# Patient Record
Sex: Male | Born: 1980 | Hispanic: Yes | Marital: Single | Smoking: Never smoker
Health system: Southern US, Community
[De-identification: ages and names within clinical notes are randomized; demographics above are authoritative.]

## PROBLEM LIST (undated history)

## (undated) DIAGNOSIS — E7800 Pure hypercholesterolemia, unspecified: Secondary | ICD-10-CM

---

## 2014-04-23 ENCOUNTER — Emergency Department (HOSPITAL_COMMUNITY)
Admission: EM | Admit: 2014-04-23 | Discharge: 2014-04-24 | Disposition: A | Discharge status: Home / Self Care | Payer: Self-pay | Attending: Emergency Medicine | Admitting: Emergency Medicine

## 2014-04-23 ENCOUNTER — Encounter (HOSPITAL_COMMUNITY): Payer: Self-pay | Admitting: Emergency Medicine

## 2014-04-23 DIAGNOSIS — Z8639 Personal history of other endocrine, nutritional and metabolic disease: Secondary | ICD-10-CM | POA: Insufficient documentation

## 2014-04-23 DIAGNOSIS — Z862 Personal history of diseases of the blood and blood-forming organs and certain disorders involving the immune mechanism: Secondary | ICD-10-CM | POA: Insufficient documentation

## 2014-04-23 DIAGNOSIS — R1032 Left lower quadrant pain: Secondary | ICD-10-CM | POA: Insufficient documentation

## 2014-04-23 DIAGNOSIS — R109 Unspecified abdominal pain: Secondary | ICD-10-CM

## 2014-04-23 DIAGNOSIS — R1033 Periumbilical pain: Secondary | ICD-10-CM | POA: Insufficient documentation

## 2014-04-23 HISTORY — DX: Pure hypercholesterolemia, unspecified: E78.00

## 2014-04-23 LAB — CBC WITH DIFFERENTIAL/PLATELET
BASOS ABS: 0 10*3/uL (ref 0.0–0.1)
Basophils Relative: 0 % (ref 0–1)
Eosinophils Absolute: 0.2 10*3/uL (ref 0.0–0.7)
Eosinophils Relative: 4 % (ref 0–5)
HCT: 39.3 % (ref 39.0–52.0)
HEMOGLOBIN: 13.6 g/dL (ref 13.0–17.0)
LYMPHS PCT: 67 % — AB (ref 12–46)
Lymphs Abs: 3.9 10*3/uL (ref 0.7–4.0)
MCH: 28.9 pg (ref 26.0–34.0)
MCHC: 34.6 g/dL (ref 30.0–36.0)
MCV: 83.6 fL (ref 78.0–100.0)
MONO ABS: 0.5 10*3/uL (ref 0.1–1.0)
MONOS PCT: 8 % (ref 3–12)
NEUTROS PCT: 21 % — AB (ref 43–77)
Neutro Abs: 1.2 10*3/uL — ABNORMAL LOW (ref 1.7–7.7)
Platelets: 248 10*3/uL (ref 150–400)
RBC: 4.7 MIL/uL (ref 4.22–5.81)
RDW: 12.3 % (ref 11.5–15.5)
WBC: 5.9 10*3/uL (ref 4.0–10.5)

## 2014-04-23 LAB — URINALYSIS, ROUTINE W REFLEX MICROSCOPIC
BILIRUBIN URINE: NEGATIVE
Glucose, UA: NEGATIVE mg/dL
HGB URINE DIPSTICK: NEGATIVE
KETONES UR: NEGATIVE mg/dL
LEUKOCYTES UA: NEGATIVE
Nitrite: NEGATIVE
PH: 6 (ref 5.0–8.0)
PROTEIN: NEGATIVE mg/dL
Specific Gravity, Urine: 1.025 (ref 1.005–1.030)
Urobilinogen, UA: 0.2 mg/dL (ref 0.0–1.0)

## 2014-04-23 NOTE — ED Notes (Signed)
Pt arrives with abdominal pain that started 3 days ago, denies N/V/D. Pain with urination, states it feels "rough" Non English speaking

## 2014-04-24 ENCOUNTER — Emergency Department (HOSPITAL_COMMUNITY): Payer: Self-pay

## 2014-04-24 LAB — COMPREHENSIVE METABOLIC PANEL
ALT: 39 U/L (ref 0–53)
AST: 28 U/L (ref 0–37)
Albumin: 4 g/dL (ref 3.5–5.2)
Alkaline Phosphatase: 85 U/L (ref 39–117)
BILIRUBIN TOTAL: 0.2 mg/dL — AB (ref 0.3–1.2)
BUN: 11 mg/dL (ref 6–23)
CO2: 26 meq/L (ref 19–32)
CREATININE: 1.21 mg/dL (ref 0.50–1.35)
Calcium: 9.3 mg/dL (ref 8.4–10.5)
Chloride: 100 mEq/L (ref 96–112)
GFR calc Af Amer: 89 mL/min — ABNORMAL LOW (ref >90)
GFR, EST NON AFRICAN AMERICAN: 77 mL/min — AB (ref >90)
Glucose, Bld: 89 mg/dL (ref 70–99)
POTASSIUM: 3.8 meq/L (ref 3.7–5.3)
Sodium: 141 mEq/L (ref 137–147)
Total Protein: 7.2 g/dL (ref 6.0–8.3)

## 2014-04-24 LAB — LIPASE, BLOOD: Lipase: 37 U/L (ref 11–59)

## 2014-04-24 LAB — ETHANOL: Alcohol, Ethyl (B): <11 mg/dL (ref 0–11)

## 2014-04-24 MED ORDER — HYDROMORPHONE HCL PF 1 MG/ML IJ SOLN
1.0000 mg | Freq: Once | INTRAMUSCULAR | Status: AC
  Administered 2014-04-24: 1 mg via INTRAVENOUS
  Filled 2014-04-24: qty 1

## 2014-04-24 MED ORDER — ONDANSETRON HCL 4 MG/2ML IJ SOLN
4.0000 mg | Freq: Once | INTRAMUSCULAR | Status: AC
  Administered 2014-04-24: 4 mg via INTRAVENOUS
  Filled 2014-04-24: qty 2

## 2014-04-24 MED ORDER — SODIUM CHLORIDE 0.9 % IV BOLUS (SEPSIS)
1000.0000 mL | Freq: Once | INTRAVENOUS | Status: AC
  Administered 2014-04-24: 1000 mL via INTRAVENOUS

## 2014-04-24 MED ORDER — IOHEXOL 300 MG/ML  SOLN
25.0000 mL | Freq: Once | INTRAMUSCULAR | Status: AC | PRN
  Administered 2014-04-24: 25 mL via ORAL

## 2014-04-24 MED ORDER — IOHEXOL 300 MG/ML  SOLN: 100.0000 mL | Freq: Once | INTRAMUSCULAR | Status: DC | PRN

## 2014-04-24 MED ORDER — HYDROCODONE-ACETAMINOPHEN 5-325 MG PO TABS: 2.0000 | ORAL_TABLET | ORAL | Status: AC | PRN

## 2014-04-24 MED ORDER — SODIUM CHLORIDE 0.9 % IV SOLN
INTRAVENOUS | Status: DC
  Administered 2014-04-24: 999 mL via INTRAVENOUS

## 2014-04-24 NOTE — ED Notes (Signed)
Pt went to ct and is now back.  Denies pain at this time.

## 2014-04-24 NOTE — ED Provider Notes (Signed)
CSN: [id number]     Arrival date & time 04/23/14  2234 History   First MD Initiated Contact with Patient 04/24/14 0010     Chief Complaint  Patient presents with  . Abdominal Pain     (Consider location/radiation/quality/duration/timing/severity/associated sxs/prior Treatment) Patient is a 33 y.o. male presenting with abdominal pain. The history is provided by the patient.  Abdominal Pain  He has had intermittent abdominal pain, for 4 days, which is worse each day. He denies fever, chills, nausea, vomiting, constipation, or diarrhea. He's never had this previously. The pain is currently 5/10. No known sick contacts. He works, doing Network engineerinsulation, and metal work. There are no other known modifying factors.  Past Medical History  Diagnosis Date  . High cholesterol    History reviewed. No pertinent past surgical history. No family history on file. History  Substance Use Topics  . Smoking status: Never Smoker   . Smokeless tobacco: Not on file  . Alcohol Use: Yes    Review of Systems  Gastrointestinal: Positive for abdominal pain.  All other systems reviewed and are negative.     Allergies  Review of patient's allergies indicates no known allergies.  Home Medications   Prior to Admission medications   Medication Sig Start Date End Date Taking? Authorizing Provider  HYDROcodone-acetaminophen (NORCO) 5-325 MG per tablet Take 2 tablets by mouth every 4 (four) hours as needed. 04/24/14   Flint MelterElliott L Tasheka Houseman, MD   BP 132/80  Pulse 63  Temp(Src) 98.3 F (36.8 C) (Oral)  Resp 12  Ht 5' (1.524 m)  Wt 160 lb (72.576 kg)  BMI 31.25 kg/m2  SpO2 100% Physical Exam  Nursing note and vitals reviewed. Constitutional: He is oriented to person, place, and time. He appears well-developed and well-nourished.  HENT:  Head: Normocephalic and atraumatic.  Right Ear: External ear normal.  Left Ear: External ear normal.  Eyes: Conjunctivae and EOM are normal. Pupils are equal, round, and  reactive to light.  Neck: Normal range of motion and phonation normal. Neck supple.  Cardiovascular: Normal rate, regular rhythm, normal heart sounds and intact distal pulses.   Pulmonary/Chest: Effort normal and breath sounds normal. He exhibits no bony tenderness.  Abdominal: Soft. He exhibits no mass. There is no tenderness (Left lower quadrant, and periumbilical tenderness, moderate). There is no rebound and no guarding.  Musculoskeletal: Normal range of motion.  Neurological: He is alert and oriented to person, place, and time. No cranial nerve deficit or sensory deficit. He exhibits normal muscle tone. Coordination normal.  Skin: Skin is warm, dry and intact.  Psychiatric: He has a normal mood and affect. His behavior is normal. Judgment and thought content normal.    ED Course  Procedures (including critical care time) Medications  0.9 %  sodium chloride infusion ( Intravenous Stopped 04/24/14 0553)  iohexol (OMNIPAQUE) 300 MG/ML solution 100 mL (not administered)  sodium chloride 0.9 % bolus 1,000 mL (0 mLs Intravenous Stopped 04/24/14 0114)  HYDROmorphone (DILAUDID) injection 1 mg (1 mg Intravenous Given 04/24/14 0156)  ondansetron (ZOFRAN) injection 4 mg (4 mg Intravenous Given 04/24/14 0156)  iohexol (OMNIPAQUE) 300 MG/ML solution 25 mL (25 mLs Oral Contrast Given 04/24/14 0201)    Patient Vitals for the past 24 hrs:  BP Temp Temp src Pulse Resp SpO2 Height Weight  04/24/14 0530 132/80 mmHg - - 63 - 100 % - -  04/24/14 0500 119/90 mmHg - - 62 - 100 % - -  04/24/14 0430 129/83 mmHg - -  56 - 100 % - -  04/24/14 0400 129/65 mmHg - - 65 - 97 % - -  04/24/14 0330 116/74 mmHg - - 58 - 97 % - -  04/24/14 0300 109/85 mmHg - - 64 - 97 % - -  04/24/14 0240 - - - 68 - 98 % - -  04/24/14 0238 132/87 mmHg - - - - - - -  04/24/14 0200 134/97 mmHg - - 65 12 100 % - -  04/24/14 0130 119/83 mmHg - - 61 16 98 % - -  04/24/14 0100 133/92 mmHg - - 56 18 100 % - -  04/24/14 0045 130/90 mmHg - -  54 12 100 % - -  04/24/14 0030 139/83 mmHg - - 55 19 100 % - -  04/24/14 0015 135/88 mmHg - - 52 12 100 % - -  04/24/14 0000 131/93 mmHg - - 58 14 100 % - -  04/23/14 2345 128/87 mmHg - - 54 18 100 % - -  04/23/14 2330 140/89 mmHg - - 62 19 100 % - -  04/23/14 2247 134/90 mmHg 98.3 F (36.8 C) Oral 57 12 100 % 5' (1.524 m) 160 lb (72.576 kg)       Labs Review Labs Reviewed  CBC WITH DIFFERENTIAL - Abnormal; Notable for the following:    Neutrophils Relative % 21 (*)    Neutro Abs 1.2 (*)    Lymphocytes Relative 67 (*)    All other components within normal limits  COMPREHENSIVE METABOLIC PANEL - Abnormal; Notable for the following:    Total Bilirubin 0.2 (*)    GFR calc non Af Amer 77 (*)    GFR calc Af Amer 89 (*)    All other components within normal limits  LIPASE, BLOOD  URINALYSIS, ROUTINE W REFLEX MICROSCOPIC  ETHANOL    CLINICAL DATA: Right-sided abdominal pain.  EXAM:  CT ABDOMEN AND PELVIS WITH CONTRAST  TECHNIQUE:  Multidetector CT imaging of the abdomen and pelvis was performed  using the standard protocol following bolus administration of  intravenous contrast.  CONTRAST: 100 mL of Omnipaque 300 IV contrast  COMPARISON: None.  FINDINGS:  The visualized lung bases are clear. Residual contrast is seen  within the distal esophagus.  The liver and spleen are unremarkable in appearance. The gallbladder  is within normal limits. The pancreas and adrenal glands are  unremarkable.  The kidneys are unremarkable in appearance. There is no evidence of  hydronephrosis. No renal or ureteral stones are seen. No perinephric  stranding is appreciated.  No free fluid is identified. The small bowel is unremarkable in  appearance. The stomach is within normal limits. No acute vascular  abnormalities are seen.  The appendix is normal in caliber and contains air, without evidence  for appendicitis. The colon is unremarkable in appearance.  The bladder is mildly distended  and grossly unremarkable in  appearance. The prostate remains normal in size. No inguinal  lymphadenopathy is seen.  No acute osseous abnormalities are identified. 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  IMPRESSION:  1. No acute abnormality seen within the abdomen or pelvis.  2. 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  Electronically Signed  By: Roanna RaiderJeffery Chang M.D.  On: 04/24/2014 02:40     EKG Interpretation None      MDM   Final diagnoses:  Abdominal pain    Nonspecific abdominal pain, with negative evaluation in emergency department. Is no evidence for colitis, appendicitis, urinary  tract infection or metabolic instability.   Nursing Notes Reviewed/ Care Coordinated Applicable Imaging Reviewed Interpretation of Laboratory Data incorporated into ED treatment  The patient appears reasonably screened and/or stabilized for discharge and I doubt any other medical condition or other [facility] requiring further screening, evaluation, or treatment in the ED at this time prior to discharge.  Plan: Home Medications- Norco; Home Treatments- rest; return here if the recommended treatment, does not improve the symptoms; Recommended follow up- PCP prn    Flint Melter, MD 04/24/14 [phone]

## 2014-04-24 NOTE — Discharge Instructions (Signed)
Followup with the Dr. of your choice if not better in 3 days    Dolor abdominal en adultos (Abdominal Pain, Adult) El dolor puede tener muchas causas. Normalmente la causa del dolor abdominal no es una enfermedad y Scientist, clinical (histocompatibility and immunogenetics)mejorar sin TEFL teachertratamiento. Frecuentemente puede controlarse y tratarse en casa. Su mdico le Medical sales representativerealizar un examen fsico y posiblemente solicite anlisis de sangre y radiografas para ayudar a Chief Strategy Officerdeterminar la gravedad de su dolor. Sin embargo, en IAC/InterActiveCorpmuchos casos, debe transcurrir ms tiempo antes de que se pueda Clinical research associateencontrar una causa evidente del dolor. Antes de llegar a ese punto, es posible que su mdico no sepa si necesita ms pruebas o un tratamiento ms profundo. INSTRUCCIONES PARA EL CUIDADO EN EL HOGAR  Est atento al dolor para ver si hay cambios. Las siguientes indicaciones ayudarn a Architectural technologistaliviar cualquier molestia que pueda sentir:  Gorhamome solo medicamentos de venta libre o recetados, segn las indicaciones del mdico.  No tome laxantes a menos que se lo haya indicado su mdico.  Pruebe con Neomia Dearuna dieta lquida absoluta (caldo, t o agua) segn se lo indique su mdico. Introduzca gradualmente una dieta normal, segn su tolerancia. SOLICITE ATENCIN MDICA SI:  Tiene dolor abdominal sin explicacin.  Tiene dolor abdominal relacionado con nuseas o diarrea.  Tiene dolor cuando orina o defeca.  Experimenta dolor abdominal que lo despierta de noche.  Tiene dolor abdominal que empeora o mejora cuando come alimentos.  Tiene dolor abdominal que empeora cuando come alimentos grasosos. SOLICITE ATENCIN MDICA DE INMEDIATO SI:   El dolor no desaparece en un plazo mximo de 2horas.  Tiene fiebre.  No deja de (vomitar).  El Engineer, miningdolor se siente solo en partes del abdomen, como el lado derecho o la parte inferior izquierda del abdomen.  Evaca materia fecal sanguinolenta o negra, de aspecto alquitranado. ASEGRESE DE QUE:  Comprende estas instrucciones.  Controlar su  afeccin.  Recibir ayuda de inmediato si no mejora o si empeora. Document Released: 10/28/2005 Document Revised: 08/18/2013 [facility] Patient Information 2014 [place], [place].   Emergency Department Resource Guide 1) Find a Doctor and Pay Out of Pocket Although you won't have to find out who is covered by your insurance plan, it is a good idea to ask around and get recommendations. You will then need to call the office and see if the doctor you have chosen will accept you as a new patient and what types of options they offer for patients who are self-pay. Some doctors offer discounts or will set up payment plans for their patients who do not have insurance, but you will need to ask so you aren't surprised when you get to your appointment.  2) Contact Your Local Health Department Not all health departments have doctors that can see patients for sick visits, but many do, so it is worth a call to see if yours does. If you don't know where your local health department is, you can check in your phone book. The CDC also has a tool to help you locate your state's health department, and many state websites also have listings of all of their local health departments.  3) Find a Walk-in Clinic If your illness is not likely to be very severe or complicated, you may want to try a walk in clinic. These are popping up all over the country in pharmacies, drugstores, and shopping centers. They're usually staffed by nurse practitioners or physician assistants that have been trained to treat common illnesses and complaints. They're usually fairly quick and inexpensive. However, if  you have serious medical issues or chronic medical problems, these are probably not your best option.  No Primary Care Doctor: - Call Health Connect at  [phone] - they can help you locate a primary care doctor that  accepts your insurance, provides certain services, etc. - Physician Referral Service- [phone]  Chronic Pain  Problems: Organization         Address  Phone   Notes  [facility]  [phone]) [phone] Patients need to be referred by their primary care doctor.   Medication Assistance: Organization         Address  Phone   Notes  [facility] Medication [facility] [address] [place], [place] [address] [phone]) [phone] --Must be a resident of [facility] [place] -- Must have NO insurance coverage whatsoever (no Medicaid/ Medicare, etc.) -- The pt. MUST have a primary care doctor that directs their care regularly and follows them in the community   MedAssist  [phone]   [employer]  [phone]) [phone]    Agencies that provide inexpensive medical care: Organization         Address  Phone   Notes  [facility]  [phone]   [facility]    [phone]   [facility] [address] [place], [place] [address] [phone]) [phone]   [facility] [place] [address], [place] [phone]) [phone]   [facility]    [phone](336) [phone]   [facility]    [phone]   [facility]  [address], [place] Phone:  [phone](336) [phone], Fax:  [phone](336) [phone] Hours of Operation:  9 am - 6 pm, M-F.  Also accepts Medicaid/Medicare and self-pay.  [facility]  [address], [place] Phone: [phone](336) [phone], Fax: [phone]. Hours of Operation:  8:30 am - 5:30 pm, M-F.  Also accepts Medicaid and self-pay.  [facility] [place] [address], [place] Phone: [phone]) [phone]   [facility] [address], [place], [place] [phone], Ext. [phone] Mondays & Thursdays: 7-9 AM.  First 15 patients are seen on a first come, first serve basis.    Medicaid-accepting [facility] Providers:  Organization         Address  Phone   Notes  [facility] [address], [place] [phone] Also  accepts self-pay patients.  [facility] [address] [place], [place]  [phone]) [phone]   [facility] [address], [place] [phone](336) [phone]   [facility] [address] [place] [address], [place] [phone]) [phone]   Renaye RakersVeita Bland [address], [place]   [phone]) [phone] Only accepts [place] Access [place] patients after they have their name applied to their card.   Self-Pay (no insurance) in [facility] [place]:  Organization         Address  Phone   Notes  Sickle Cell Patients, [facility] [address], [place] [phone]) [phone]   [facility] [address], [place] [phone]) [phone]   [facility] [place]  [address], [place] [phone]) [phone]   [facility]/Dr. Osei-Bonsu  [address] [place] [address], [place] or [phone], [place] [phone] Phone number for both [facility] and  [place] locations is the same.  [facility] [address], [place] [phone]   [facility] [address], [place] or [address] [phone]   [facility] [address] [place] [phone], phone; [phone], fax Sees patients 1st and 3rd Saturday of every month.  Must not qualify for public or private insurance (i.e. Medicaid, Medicare, [place] Health Choice, Veterans' Benefits)  Household income should be no more than 200% of the poverty level The clinic cannot treat you if you are pregnant or think you are pregnant  Sexually transmitted diseases are not treated at the clinic.    Dental Care: Organization         Address  Phone  Notes  [facility] Department of [facility] [address] [place], [place] [phone] Accepts children up to age 51 who are enrolled in [place] or [place] Health Choice; pregnant  women with a Medicaid card; and children who have applied for Medicaid or [place] Health Choice, but were declined, whose parents can pay a reduced fee at time of service.  [facility] Department of [facility]  [address], [place] [phone] Accepts children up to age 29 who are enrolled in [place] or [place] Health Choice; pregnant women with a Medicaid card; and children who have applied for Medicaid or [place] Health Choice, but were declined, whose parents can pay a reduced fee at time of service.  [facility]  [address] [place], [place] [phone] Patients are seen by appointment only. Walk-ins are not accepted. [facility] will see patients 26 years of age and older. Monday - Tuesday (8am-5pm) Most Wednesdays (8:30-5pm) $30 per visit, cash only  [facility] Adult Dental Access PROGRAM  [address], [facility] [phone] Patients are seen by appointment only. Walk-ins are not accepted. [facility] will see patients 82 years of age and older. One Wednesday Evening (Monthly: Volunteer Based).  $30 per visit, cash only  [employer]  [phone] for adults; Children under age 22, call Graduate Pediatric Dentistry at [phone]. Children aged 75-14, please call [phone] to request a pediatric application.  Dental services are provided in all areas of dental care including fillings, crowns and bridges, complete and partial dentures, implants, gum treatment, root canals, and extractions. Preventive care is also provided. Treatment is provided to both adults and children. Patients are selected via a lottery and there is often a waiting list.   [facility] [address] [place]  [phone] [email or web address]   [facility] [address] [place], [place] [phone], Ext. [phone] Second and Fourth Thursday of each month, opens at 6:30 AM; Clinic ends at 9 AM.  Patients are  seen on a first-come first-served basis, and a limited number are seen during each clinic.   [facility]  [address] [place], [place] [phone]   Eligibility Requirements You must have lived in [place], [place], or [place] counties for at least the last three months.   You cannot be eligible for state or federal sponsored [place], including [employer], [place], or [employer].   You generally cannot be eligible for healthcare insurance through your employer.    How to apply: Eligibility screenings are held every Tuesday and Wednesday afternoon from 1:00 pm until 4:00 pm. You do not  need an appointment for the interview!  [facility] [address], [place], [place] [phone]   [facility] Health Department  [phone]   [facility] Health Department  [phone]   [facility] Health Department  [phone]    Behavioral Health Resources in the Community: Intensive Outpatient Programs Organization         Address  Phone  Notes  [facility] Services [address], [place], [place] [phone]   [facility] Outpatient [address] [place], [place] [phone]   [facility]: [facility] S[facility] [address], [place], [place]  [phone]   [facility] Mental Health [address],  [place], [place] [phone] or [phone]   Substance Abuse Resources Organization         Address  Phone  Notes  Alcohol and Drug Services  [phone]   [facility]  [phone]   The [place]  [phone]   Floydene Flock  [phone]   Residential & Outpatient Substance Abuse Program  [phone]   Psychological Services Organization         Address  Phone  Notes  [facility] Behavior[facility] Health  [phone]   [facility] Services  [phone]   [facility] Mental Health [address] [place] [phone] or [phone]    Mobile Crisis  Teams Organization         Address  Phone  Notes  [facility], Mobile Crisis Care Unit  [phone]   [facility]  [address] [place], [place] [phone]   Doristine Locks [address] [place] [phone]    Self-Help/Support Groups Organization         Address  Phone             Notes  [facility] [place] - variety of support groups  [phone]- I7437963 Call for more information  Narcotics Anonymous (NA), [facility]aring [facility] [address], [employer] [place]  2 meetings at this location   Statistician         Address  Phone  Notes  [facility] [address],    [place]  [phone]   [facility]  [address], [place] [address], [place], [place] [phone]   [facility] [address] [place], [place] [phone] Admissions: 8am-3pm M-F  [facility] [address]    [place], [place] [phone]   [facility] [address] [place], [place], [place] [phone]   [facility] [address]  [place], [place] [phone]   [facility] - Intensive Outpatient [address], [place], [place] [phone]   [facility] (Addiction Recovery [facility] [address]  [place], [place] [phone] or [phone]   [facility] Services (RTS) [address], [place], [place] [phone] Accepts Medicaid  [facility] [place] [address]  [place] [phone] Substance Abuse/Addiction Treatment   [facility] Organization         Address  Phone  Notes  [facility]  [phone]   Angie Fava, PhD [address] [place], [place]   [phone] or [phone]   [facility] Behavioral   [address] [place], [place] [phone]   [facility] [address] [place], [place] [phone]  Insurance/Medicaid/sponsorship through [employer] and Families [address]  [place], [place] [phone] [place], [place] [phone]    Dr. Adele Schilder  [phone]   [facility] [place] Dept. 1) [address], [place] 2) [place] 3)  [place] [address], [place] [phone]  [phone]   [place] [phone] or [phone] (After Hours)

## 2014-04-24 NOTE — ED Notes (Signed)
Pt reports epigastric pain that began 4-5 days ago and has gradually worsened.  Admits to drinking 18-20 beers over weekends.  Last drank approx 20 beers last night.  Has not had previous episodes of same.  Denies acid reflux, vomiting, diarrhea.

## 2014-11-06 IMAGING — CT CT ABD-PELV W/ CM
2 of 4 series · 16 of 46 positions shown, 18 images · IV contrast (Omni 300)
Comparison: None.

CLINICAL DATA: Right-sided abdominal pain.

EXAM:
CT ABDOMEN AND PELVIS WITH CONTRAST
TECHNIQUE: Multidetector CT imaging of the abdomen and pelvis was performed
using the standard protocol following bolus administration of
intravenous contrast.
CONTRAST:  100 mL of Omnipaque 300 IV contrast

[Series 2: abd/ pelvis 5.0 i30f 1 · axial · 0.71mm/px · z∈[+954,+1404]mm · 13 of 98 slices shown, 15 images]
[im 4/98  soft-tissue]
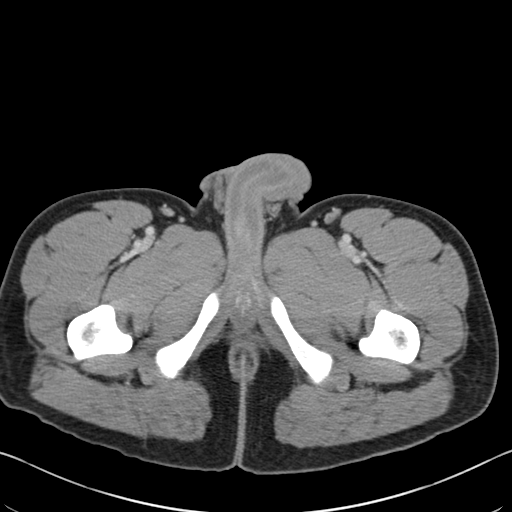
[im 4/98  bone]
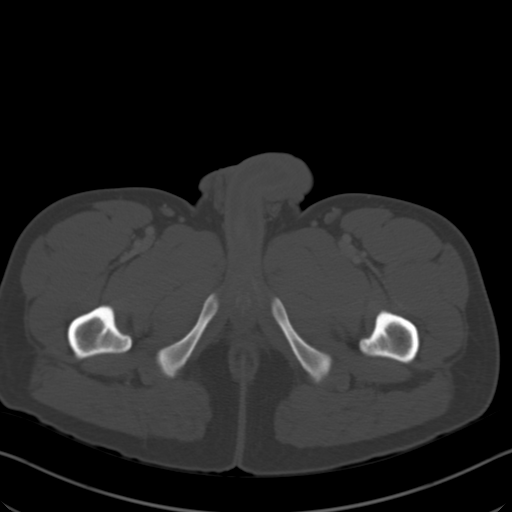
[im 12/98  soft-tissue]
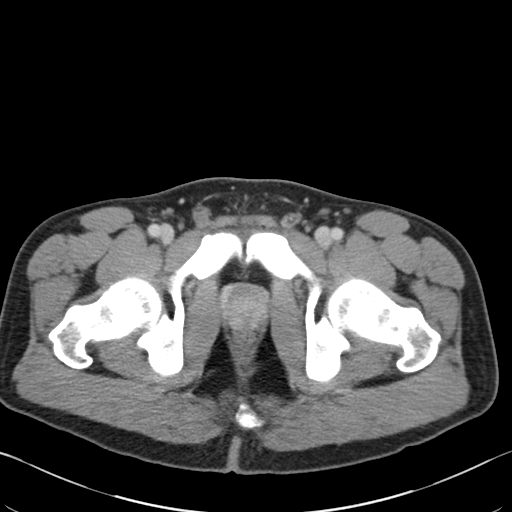
[im 20/98  soft-tissue]
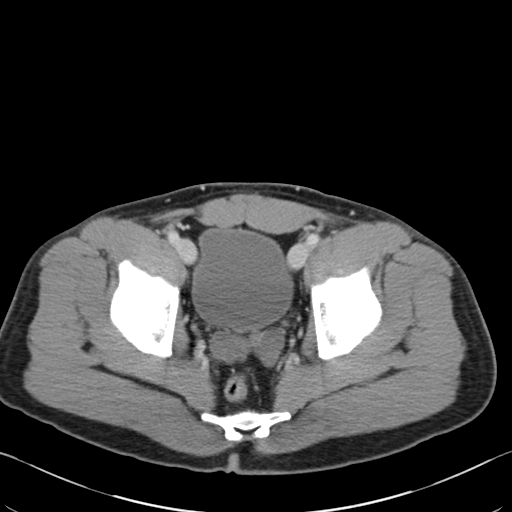
[im 28/98  soft-tissue]
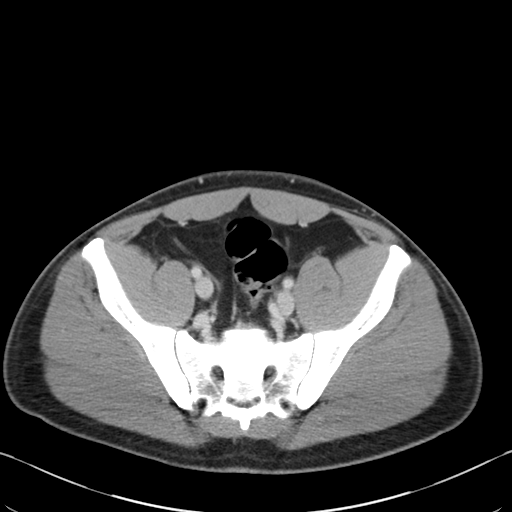
[im 35/98  soft-tissue]
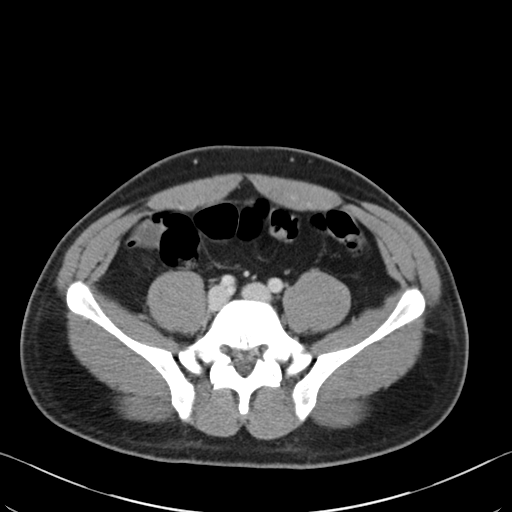
[im 43/98  soft-tissue]
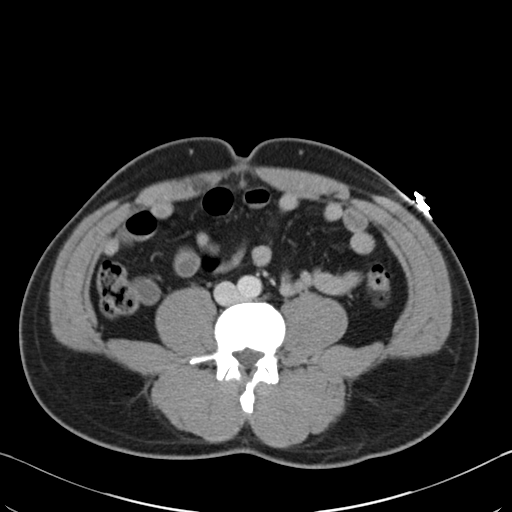
[im 51/98  soft-tissue]
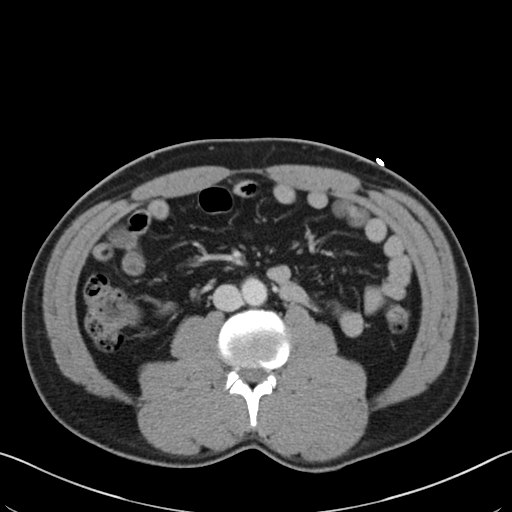
[im 55/98  soft-tissue]
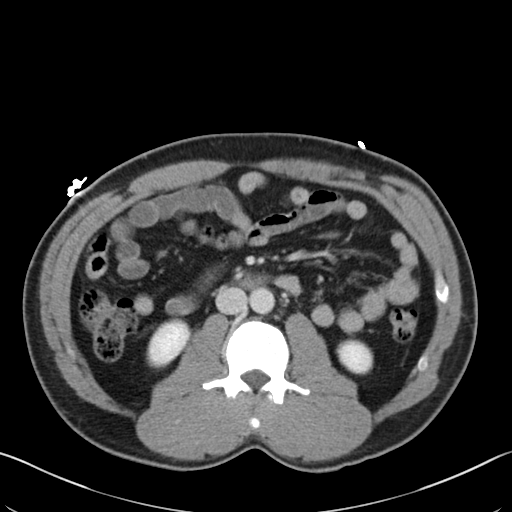
[im 63/98  soft-tissue]
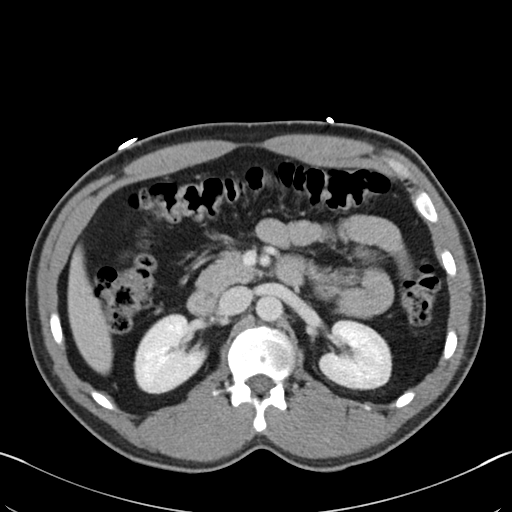
[im 63/98  bone]
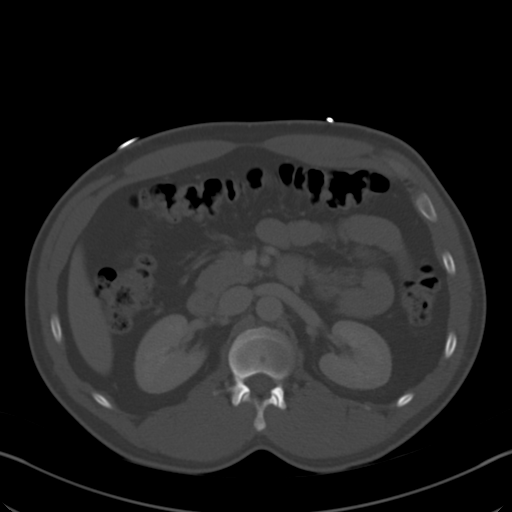
[im 70/98  soft-tissue]
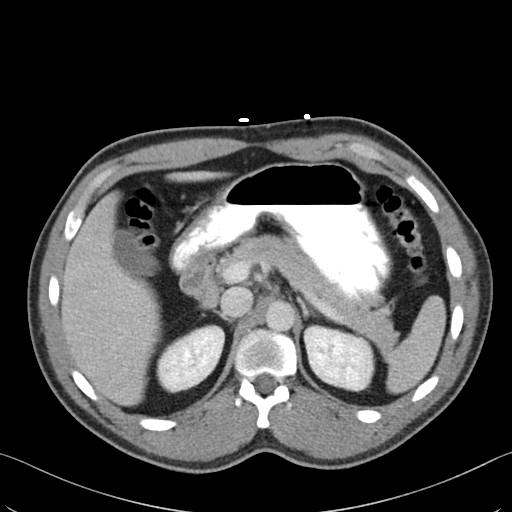
[im 78/98  soft-tissue]
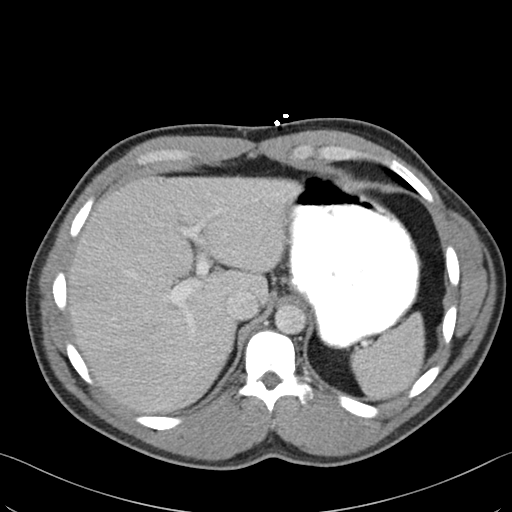
[im 86/98  soft-tissue]
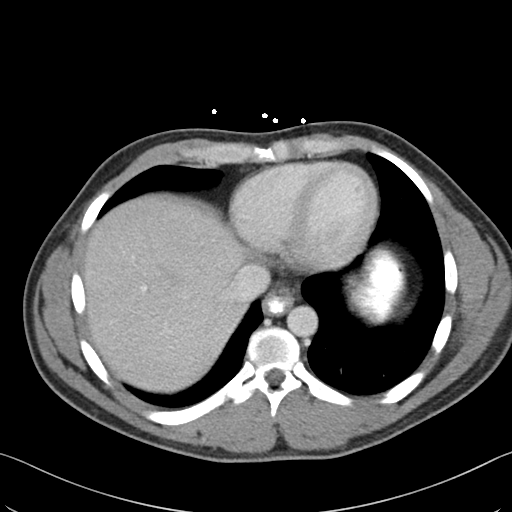
[im 94/98  soft-tissue]
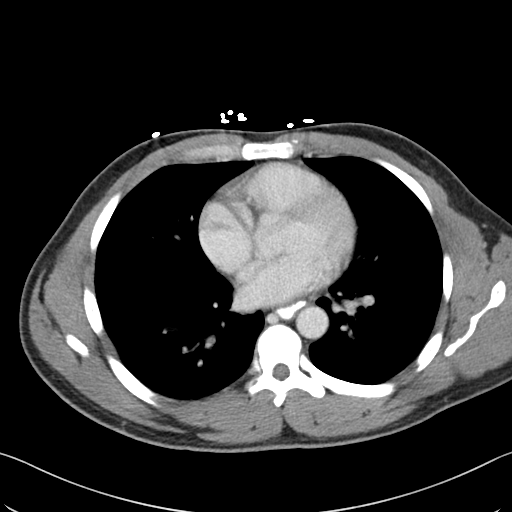

[Series 5: coronals · coronal · 0.66mm/px · 3 of 117 slices shown]
[im 39/117  soft-tissue]
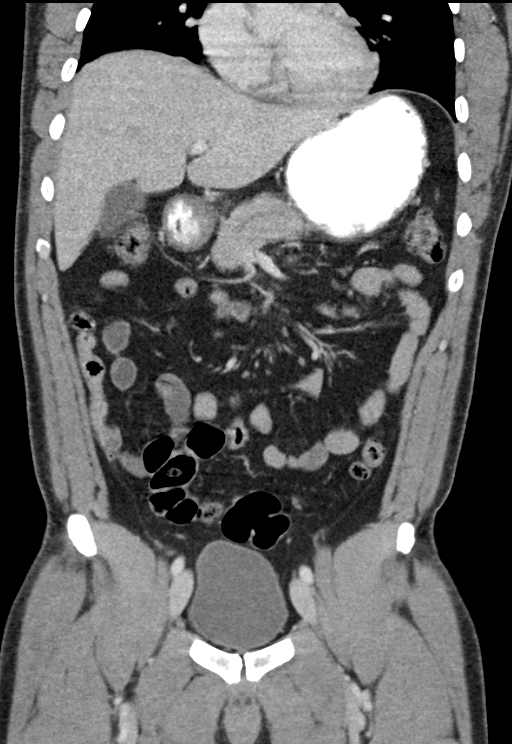
[im 52/117  soft-tissue]
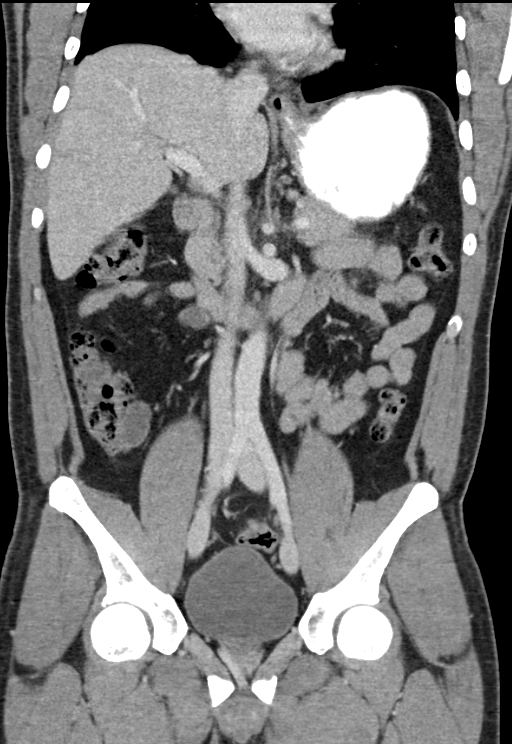
[im 65/117  soft-tissue]
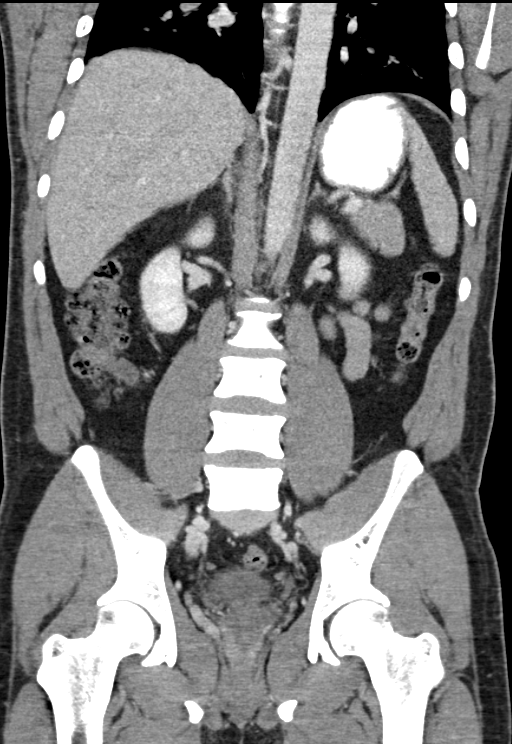

[16 of 46 positions shown; findings below may reference images not displayed]

FINDINGS: The visualized lung bases are clear. Residual contrast is seen
within the distal esophagus.

The liver and spleen are unremarkable in appearance. The gallbladder
is within normal limits. The pancreas and adrenal glands are
unremarkable.

The kidneys are unremarkable in appearance. There is no evidence of
hydronephrosis. No renal or ureteral stones are seen. No perinephric
stranding is appreciated.

No free fluid is identified. The small bowel is unremarkable in
appearance. The stomach is within normal limits. No acute vascular
abnormalities are seen.

The appendix is normal in caliber and contains air, without evidence
for appendicitis. The colon is unremarkable in appearance.

The bladder is mildly distended and grossly unremarkable in
appearance. The prostate remains normal in size. No inguinal
lymphadenopathy is seen.

No acute osseous abnormalities are identified. Chronic bilateral
pars defects are seen at L5.
IMPRESSION: 1. No acute abnormality seen within the abdomen or pelvis.
2. Chronic bilateral pars defects seen at L5.

## 2016-04-26 ENCOUNTER — Encounter (HOSPITAL_COMMUNITY): Payer: Self-pay | Admitting: Emergency Medicine

## 2016-04-26 DIAGNOSIS — Z79899 Other long term (current) drug therapy: Secondary | ICD-10-CM | POA: Insufficient documentation

## 2016-04-26 DIAGNOSIS — Z791 Long term (current) use of non-steroidal anti-inflammatories (NSAID): Secondary | ICD-10-CM | POA: Insufficient documentation

## 2016-04-26 DIAGNOSIS — E7800 Pure hypercholesterolemia, unspecified: Secondary | ICD-10-CM | POA: Insufficient documentation

## 2016-04-26 DIAGNOSIS — K529 Noninfective gastroenteritis and colitis, unspecified: Secondary | ICD-10-CM | POA: Insufficient documentation

## 2016-04-26 LAB — URINALYSIS, ROUTINE W REFLEX MICROSCOPIC
BILIRUBIN URINE: NEGATIVE
Glucose, UA: NEGATIVE mg/dL
HGB URINE DIPSTICK: NEGATIVE
Ketones, ur: NEGATIVE mg/dL
Leukocytes, UA: NEGATIVE
Nitrite: NEGATIVE
PH: 7 (ref 5.0–8.0)
Protein, ur: 30 mg/dL — AB
SPECIFIC GRAVITY, URINE: 1.027 (ref 1.005–1.030)

## 2016-04-26 LAB — COMPREHENSIVE METABOLIC PANEL
ALK PHOS: 64 U/L (ref 38–126)
ALT: 49 U/L (ref 17–63)
AST: 38 U/L (ref 15–41)
Albumin: 3.7 g/dL (ref 3.5–5.0)
Anion gap: 6 (ref 5–15)
BUN: 10 mg/dL (ref 6–20)
CO2: 26 mmol/L (ref 22–32)
CREATININE: 1.1 mg/dL (ref 0.61–1.24)
Calcium: 9 mg/dL (ref 8.9–10.3)
Chloride: 104 mmol/L (ref 101–111)
GFR calc Af Amer: >60 mL/min (ref >60)
GFR calc non Af Amer: >60 mL/min (ref >60)
Glucose, Bld: 112 mg/dL — ABNORMAL HIGH (ref 65–99)
Potassium: 4.3 mmol/L (ref 3.5–5.1)
SODIUM: 136 mmol/L (ref 135–145)
Total Bilirubin: 0.5 mg/dL (ref 0.3–1.2)
Total Protein: 7.5 g/dL (ref 6.5–8.1)

## 2016-04-26 LAB — CBC
HEMATOCRIT: 39.9 % (ref 39.0–52.0)
Hemoglobin: 13.2 g/dL (ref 13.0–17.0)
MCH: 27.2 pg (ref 26.0–34.0)
MCHC: 33.1 g/dL (ref 30.0–36.0)
MCV: 82.1 fL (ref 78.0–100.0)
Platelets: 199 10*3/uL (ref 150–400)
RBC: 4.86 MIL/uL (ref 4.22–5.81)
RDW: 12.5 % (ref 11.5–15.5)
WBC: 5.3 10*3/uL (ref 4.0–10.5)

## 2016-04-26 LAB — URINE MICROSCOPIC-ADD ON

## 2016-04-26 LAB — LIPASE, BLOOD: Lipase: 30 U/L (ref 11–51)

## 2016-04-26 NOTE — ED Notes (Signed)
Patient arrives with complaint of abdominal pain. States onset of Wednesday. Abdominal pain is diffuse. Additionally reports diarrhea which is exacerbated by eating. Denies Emesis. Endorses chills and fever up to 100 at home.

## 2016-04-27 ENCOUNTER — Emergency Department (HOSPITAL_COMMUNITY)
Admission: EM | Admit: 2016-04-27 | Discharge: 2016-04-27 | Disposition: A | Discharge status: Home / Self Care | Payer: Self-pay | Attending: Emergency Medicine | Admitting: Emergency Medicine

## 2016-04-27 DIAGNOSIS — K529 Noninfective gastroenteritis and colitis, unspecified: Secondary | ICD-10-CM

## 2016-04-27 MED ORDER — METRONIDAZOLE 500 MG PO TABS
500.0000 mg | ORAL_TABLET | Freq: Two times a day (BID) | ORAL | Status: AC
Start: 2016-04-27 — End: ?

## 2016-04-27 MED ORDER — CIPROFLOXACIN HCL 500 MG PO TABS: 500.0000 mg | ORAL_TABLET | Freq: Two times a day (BID) | ORAL | Status: AC

## 2016-04-27 MED ORDER — IBUPROFEN 600 MG PO TABS: 600.0000 mg | ORAL_TABLET | Freq: Four times a day (QID) | ORAL | Status: AC | PRN

## 2016-04-27 MED ORDER — LOPERAMIDE HCL 2 MG PO CAPS: 2.0000 mg | ORAL_CAPSULE | Freq: Every evening | ORAL | Status: AC | PRN

## 2016-04-27 NOTE — ED Notes (Signed)
Patient Alert and oriented X4. Stable and ambulatory. Patient verbalized understanding of the discharge instructions.  Patient belongings were taken by the patient.  

## 2016-04-27 NOTE — ED Provider Notes (Signed)
CSN: [id number]     Arrival date & time 04/26/16  2120 History  By signing my name below, I, Nathan Stephens, attest that this documentation has been prepared under the direction and in the presence of Derwood KaplanAnkit Karlos Scadden, MD. Electronically Signed: Phillis HaggisGabriella Stephens, ED Scribe. 04/27/2016. 1:40 AM.   Chief Complaint  Patient presents with  . Diarrhea  . Abdominal Pain   The history is provided by the patient. No language interpreter was used.  HPI Comments: Nathan Stephens is a 35 y.o. male who presents to the Emergency Department complaining of generalized, gradually worsening, constant, abdominal pain that radiates to the back onset 2 days ago. Pt describes the pain as "twisting" and rates pain 8/10. Pt reports associated 10 episodes of watery, brown diarrhea with mucus that is worsened with eating, chills, and fever tmax 100 F. He reports that he woke up 2x last night with diarrhea. He has tried Advil for pain to no relief. Pt reports hx of similar symptoms. Pt denies recent travel outside of the country, suspicious food intake, sick contacts, vomiting, dysuria, hematuria, melena, or hematochezia. Pt denies family hx of Crohn's disease. Pt speaks Spanish and is translated by his wife.   Past Medical History  Diagnosis Date  . High cholesterol    History reviewed. No pertinent past surgical history. History reviewed. No pertinent family history. Social History  Substance Use Topics  . Smoking status: Never Smoker   . Smokeless tobacco: None  . Alcohol Use: Yes    Review of Systems 10 Systems reviewed and all are negative for acute change except as noted in the HPI.  Allergies  Review of patient's allergies indicates no known allergies.  Home Medications   Prior to Admission medications   Medication Sig Start Date End Date Taking? Authorizing Provider  ciprofloxacin (CIPRO) 500 MG tablet Take 1 tablet (500 mg total) by mouth 2 (two) times daily. 04/27/16   Derwood KaplanAnkit Lorane Cousar, MD   HYDROcodone-acetaminophen (NORCO) 5-325 MG per tablet Take 2 tablets by mouth every 4 (four) hours as needed. Patient not taking: Reported on 04/27/2016 04/24/14   Mancel BaleElliott Wentz, MD  ibuprofen (ADVIL,MOTRIN) 600 MG tablet Take 1 tablet (600 mg total) by mouth every 6 (six) hours as needed. 04/27/16   Derwood KaplanAnkit Ayan Yankey, MD  loperamide (IMODIUM) 2 MG capsule Take 1 capsule (2 mg total) by mouth at bedtime as needed for diarrhea or loose stools. 04/27/16   Derwood KaplanAnkit Lluvia Gwynne, MD  metroNIDAZOLE (FLAGYL) 500 MG tablet Take 1 tablet (500 mg total) by mouth 2 (two) times daily. 04/27/16   [name], MD   BP 126/82 mmHg  Pulse 74  Temp(Src) 100.3 F (37.9 C) (Oral)  Resp 16  SpO2 99% Physical Exam  Constitutional: He is oriented to person, place, and time. He appears well-developed and well-nourished.  HENT:  Head: Normocephalic and atraumatic.  Eyes: EOM are normal. Pupils are equal, round, and reactive to light.  Neck: Normal range of motion. Neck supple.  Cardiovascular: Normal rate, regular rhythm and normal heart sounds.   Pulmonary/Chest: Effort normal and breath sounds normal.  Abdominal: Soft. There is generalized tenderness. There is no rebound and no guarding.  Right flank tenderness  Musculoskeletal: Normal range of motion.  Neurological: He is alert and oriented to person, place, and time.  Skin: Skin is warm and dry.  Psychiatric: He has a normal mood and affect. His behavior is normal.  Nursing note and vitals reviewed.   ED Course  Procedures (including critical care time)  DIAGNOSTIC STUDIES: Oxygen Saturation is 98% on RA, normal by my interpretation.    COORDINATION OF CARE: 1:39 AM-Discussed treatment plan which includes labs with pt at bedside and pt agreed to plan.    Labs Review Labs Reviewed  COMPREHENSIVE METABOLIC PANEL - Abnormal; Notable for the following:    Glucose, Bld 112 (*)    All other components within normal limits  URINALYSIS, ROUTINE W REFLEX  MICROSCOPIC (NOT AT [facility]) - Abnormal; Notable for the following:    Protein, ur 30 (*)    All other components within normal limits  URINE MICROSCOPIC-ADD ON - Abnormal; Notable for the following:    Squamous Epithelial / LPF 0-5 (*)    Bacteria, UA RARE (*)    All other components within normal limits  LIPASE, BLOOD  CBC    Imaging Review No results found. I have personally reviewed and evaluated these images and lab results as part of my medical decision-making.   EKG Interpretation None      MDM   Final diagnoses:  Enteritis    I personally performed the services described in this documentation, which was scribed in my presence. The recorded information has been reviewed and is accurate.  PT comes in with cc of abd pain- diffuse. Also having diarrhea and a low grade temp. No recent foreign travels, no recent antibitotics, no ibd hx in the family and no bloody stools. Likely enteritis that is viral. Outside of low grade temp, vitals are reassuring.  Will d/c with wait and watch approach on the antibiotics.    Derwood Kaplan, MD 04/28/16 [phone]

## 2016-04-27 NOTE — Discharge Instructions (Signed)
Take the antibiotics only if there is bloody stools or symptoms font get better for another 4 days. Take diarrhea meds only at night time.  Return to the ER if the symptoms are getting worse.   Colitis (Colitis) La colitis es la inflamacin del colon y puede durar un breve perodo [place](aguda) o prolongarse mucho tiempo (crnica). CAUSAS Esta afeccin puede ser causada por lo siguiente:  Virus.  Bacterias.  Reacciones a medicamentos.  Algunas enfermedades autoinmunitarias, como la enfermedad de Crohn y la colitis ulcerosa. SNTOMAS Los sntomas de esta afeccin incluyen lo siguiente:  Diarrea.  Materia fecal sanguinolenta o alquitranada.  Dolor.  Grant RutsFiebre.  Vmitos.  Cansancio (fatiga).  Prdida de peso.  Meteorismo.  Aumento repentino del dolor abdominal.  Menos deposiciones que lo habitual. DIAGNSTICO Esta afeccin se diagnostica mediante un anlisis de materia fecal o de sangre. Tambin pueden Dean Foods Companyhacerse otros estudios, como radiografas, una tomografa computarizada (TC) o una colonoscopia. TRATAMIENTO El tratamiento puede incluir lo siguiente:  Visual merchandiserDejar descansar a los intestinos, es Designer, jewellerydecir, no consumir alimentos ni lquidos durante un tiempo.  Administracin de lquidos por va intravenosa (IV).  Medicamentos para Chief Technology Officerel dolor y Technical sales engineerla diarrea.  Antibiticos.  Medicamentos con cortisona.  Ciruga. INSTRUCCIONES PARA EL CUIDADO EN EL HOGAR Comida y bebida  Siga las indicaciones del mdico respecto de las restricciones para las comidas o las bebidas.  Beba suficiente lquido para Photographermantener la orina clara o de color amarillo plido.  Trabaje con un nutricionista para determinar cules son los alimentos que Merchandiser, retailreagudizan la afeccin.  Evite los alimentos que reagudizan la afeccin.  Consumir una Tree surgeondieta bien balanceada. Medicamentos  Baxter Internationalome los medicamentos de venta libre y los recetados solamente como se lo haya indicado el mdico.  Si le recetaron un antibitico,  tmelo como se lo haya indicado el mdico. No deje de tomar los antibiticos aunque comience a sentirse mejor. Instrucciones generales  Concurra a todas las visitas de control como se lo haya indicado el mdico. Esto es importante. SOLICITE ATENCIN MDICA SI:  Los sntomas no desaparecen.  Presenta nuevos sntomas. SOLICITE ATENCIN MDICA DE INMEDIATO SI:  Tiene fiebre que no desaparece con tratamiento.  Comienza a sentir escalofros.  Se siente muy dbil, se desmaya o se deshidrata.  Ha vomitado repetidas veces.  Siente dolor intenso en el abdomen.  Su materia fecal es sanguinolenta o alquitranada.   Esta informacin no tiene Theme park managercomo fin reemplazar el consejo del mdico. Asegrese de hacerle al mdico cualquier pregunta que tenga.   Document Released: 10/28/2005 Document Revised: 07/19/2015 Elsevier Interactive Patient Education 2016 [employer]  Opciones de alimentos para ayudar a Paramedicaliviar la diarrea - Adultos (Food Choices to Help Relieve Diarrhea, Adult) Cuando se tiene diarrea, los alimentos que se ingieren y los hbitos de alimentacin son Engineer, productionmuy importantes. Elegir los Altria Groupalimentos y las bebidas adecuados ayuda a Actuaryaliviar la diarrea. Adems, debido a que la diarrea puede durar ArvinMeritorhasta siete das, debe reponer la prdida de lquidos y Customer service managerelectrolitos (como sodio, potasio y Editor, commissioningcloro) a fin de ayudar a Statisticianevitar la deshidratacin.  QU PAUTAS GENERALES DEBO SEGUIR?  Beba lentamente 1 taza (8 onzas) de lquido por cada episodio de diarrea. Si bebe una cantidad de lquidos suficiente, la orina ser de tono claro o color amarillo plido.  Consuma alimentos con almidn. Algunas buenas opciones son arroz blanco, tostada blanca, pasta, cereales con bajo contenido de fibras, papas al horno (sin cscara), galletas saladas y panecillos.  Evite las porciones grandes de cualquier vegetal cocido.  Limite las frutas  a Designer, fashion/clothing. Una porcin es  taza o un trozo pequeo.  Alimentos con  menos de 2 g de fibra por porcin.  Limite las grasas a menos de 8 cucharaditas (38g) por Futures trader.  Evite las comidas fritas.  Consuma alimentos que contengan probiticos. Los probiticos se encuentran en ciertos productos lcteos.  Evite los alimentos y las bebidas que pueden aumentar la velocidad a la que el alimento se mueve a travs del estmago y de los intestinos (tracto gastrointestinal). Lo que debe evitar:  Alimentos ricos en fibra, como frutas secas, frutas y vegetales crudos, frutos secos, semillas, alimentos con cereales integrales.  Alimentos muy condimentados y con alto contenido de Neurosurgeon.  Alimentos y bebidas endulzados con jarabe de maz de alto contenido de fructosa, miel o alcoholes de International aid/development worker, como xilitol, sorbitol y manitol. QU ALIMENTOS SE RECOMIENDAN? Cereales Arroz blanco. Pan blanco, francs o pita (fresco o tostado), incluidos los [place], los bollos y las rosquillas. Pastas blancas. Galletas de [place], [place] o [place]. Pretzels. Cereales con bajo contenido de [employer] cocidos en agua (como harina de maz, smola o crema de cereales). Muffins. Pan cimo Tostada Melba. [place].  Vegetales Papas (sin cscara). Jugo de tomates o de vegetales Vegetales bien cocidos o enlatados sin semillas. Deatra James tierna. Frutas Pur de Fisher [employer] cocido o enlatado, damascos, cerezas, cctel de frutas, pomelos, duraznos, peras o ciruelas. Bananas frescas, manzanas sin cscara, cerezas, uvas, meln, pomelo, duraznos, naranjas o ciruelas.  Carnes y otros productos con protenas Pollo al horno o hervido. Huevos. Tofu. Pescado. Mariscos. Mantequilla de man, sin trozos. Carne molida o un bife tierno bien cocido, jamn, ternera, cordero, cerdo o aves.  Lcteos Yogur natural, kefir y Dentist bebible sin Paediatric nurse. Leche sin Advice worker, suero de [place] o [place] de soja. Queso duro comn. Bebidas Bebidas deportivas. Caldos claros. Jugos de fruta diluidos (excepto de ciruelas). Gaseosas sin  cafena comunes, como gaseosa de [place]. Agua. Ts descafeinados. Soluciones de rehidratacin oral. Bebidas sin azcar no endulzadas con alcoholes de azcar. Otros Consom, caldo o sopas hechas con los alimentos recomendados.  Los artculos mencionados arriba pueden no ser [employer] de las bebidas o los alimentos recomendados. Comunquese con el nutricionista para conocer ms opciones. QU ALIMENTOS NO SE RECOMIENDAN? Cereales Cereales, galletas, pastas, panecillos y panes de cereales integrales, salvado o centeno. Arroz integral o arroz salvaje. Cereales con menos de 2 g de fibra por porcin. Tortillas de maz o tacos. Harina de avena cocida o seca. Granola. Palomitas de maz. Vegetales Vegetales crudos. Repollo, brcoli, repollitos de Bruselas, alcachofas, porotos, hojas de remolacha, maz, col rizada, legumbres, guisantes y batatas. Cscara de papas. [place] y repollo cocidos. Nils Pyle Frutas secas, incluidas las ciruelas y los dtiles. Frutas crudas. Compota o ciruelas secas. Manzanas frescas con cscara, damascos, mangos, peras, frambuesas y frutillas.  Carnes y otros productos con protenas Mantequilla de man espesa. Frutos secos y semillas. Porotos y lentejas. Panceta.  Lcteos Quesos con alto contenido de Aptos. Leche, leche chocolatada y bebidas hechas con [place], como los batidos. Crema. Helados. Dulces y [employer], donas y pan dulce. Panqueques y waffles. Grasas y [employer] Noble. Salsas a base de crema. Margarina. Aceites para ensaladas. Condimentos para ensaladas. Aceitunas. Agu[place].  Bebidas Bebidas con cafena (como caf, t, refrescos o bebidas energizantes). Bebidas alcohlicas. Jugos de frutas con pulpa. Jugo de ciruelas. Bebidas endulzadas con jarabe de maz de alto contenido de fructosa o alcoholes de International aid/development worker. Otros Coco. Salsa picante. [place] en polvo. [place]. Salsas. Sopas a base  de crema o de [place].  Los artculos mencionados arriba  pueden no ser [employer]theon de las bebidas y los alimentos que se Theatre stage manager. Comunquese con el nutricionista para recibir ms informacin. QU DEBO HACER SI ME DESHIDRATO? Algunas veces, la diarrea puede producir deshidratacin. Entre los signos de deshidratacin se incluyen la orina oscura y la boca y la piel secas. Si piensa que est deshidratado, debe rehidratarse con una solucin de rehidratacin oral. Estas soluciones se pueden comprar en las farmacias, en las tiendas minoristas o por Internet.  Beba  o 1 taza (120-218ml) de solucin de rehidratacin oral cada vez que tenga un episodio de diarrea. Si beber esta cantidad empeora la diarrea, intente beber en cantidades ms pequeas con ms frecuencia. Por ejemplo, tomar 1-3 cucharaditas (5-38ml) cada 5-52minutos.  Una regla general para mantenerse hidratado es beber 1  -2 litros de lquido Air cabin crew. Hable con el mdico sobre la cantidad especfica que usted debe beber diariamente. Beba suficiente lquido para Photographer orina clara o de color amarillo plido.   Esta informacin no tiene Theme park manager el consejo del mdico. Asegrese de hacerle al mdico cualquier pregunta que tenga.   Document Released: 10/28/2005 Document Revised: 11/18/2014 Elsevier Interactive Patient Education [employer]

## 2016-04-27 NOTE — ED Notes (Signed)
MD at bedside.
